# Patient Record
Sex: Female | Born: 1935 | Race: White | Hispanic: No
Health system: Southern US, Community
[De-identification: ages and names within clinical notes are randomized; demographics above are authoritative.]

---

## 2004-12-08 ENCOUNTER — Ambulatory Visit: Payer: Self-pay | Admitting: Otolaryngology

## 2004-12-24 ENCOUNTER — Ambulatory Visit: Payer: Self-pay | Admitting: Otolaryngology

## 2004-12-29 ENCOUNTER — Inpatient Hospital Stay: Payer: Self-pay | Admitting: Otolaryngology

## 2005-08-23 ENCOUNTER — Ambulatory Visit: Payer: Self-pay | Admitting: Family Medicine

## 2005-09-01 ENCOUNTER — Ambulatory Visit: Payer: Self-pay | Admitting: Orthopaedic Surgery

## 2006-03-06 ENCOUNTER — Inpatient Hospital Stay: Payer: Self-pay | Admitting: Orthopaedic Surgery

## 2006-09-21 ENCOUNTER — Ambulatory Visit: Payer: Self-pay | Admitting: Family Medicine

## 2006-09-28 IMAGING — CT CT NECK WITHOUT AND WITH CONTRAST
1 of 5 series · 5 of 14 positions shown, 7 images · non-contrast
Comparison: none

REASON FOR EXAM: Acute Lymphadenitis Neck Swelling
COMMENTS:

[Series 5: inspace with · axial · 0.42mm/px · z∈[+1170,+1349]mm · 5 of 337 slices shown, 7 images]
[im 57/337  soft-tissue]
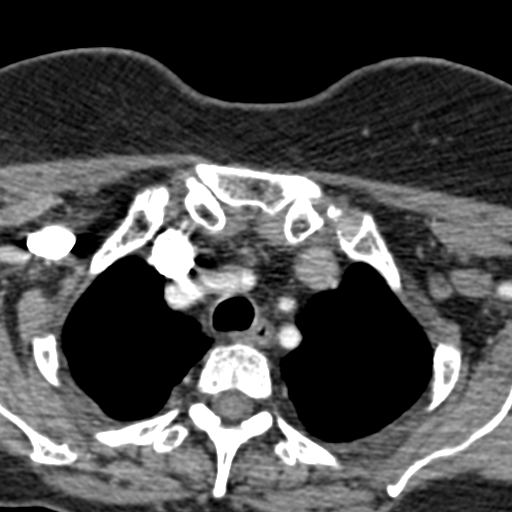
[im 57/337  bone]
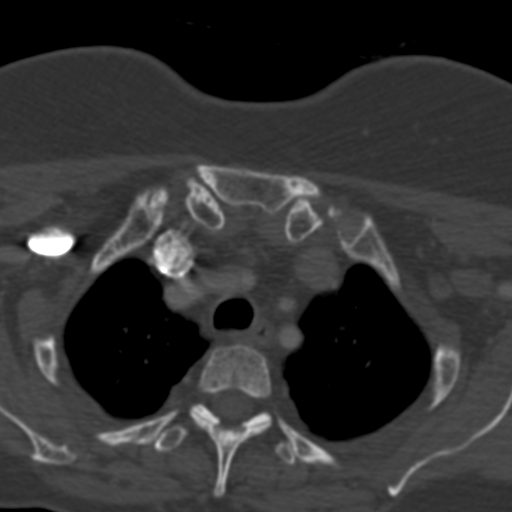
[im 113/337  bone]
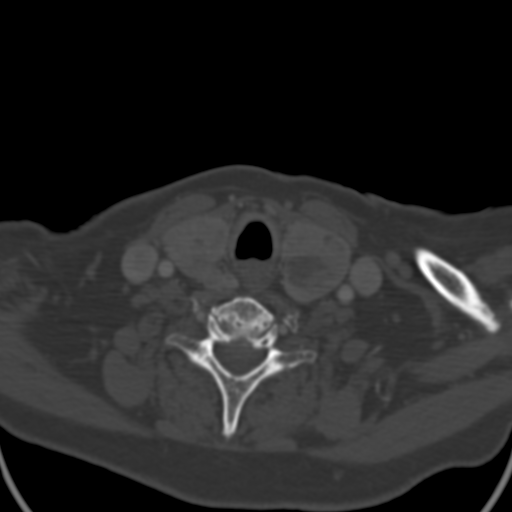
[im 169/337  bone]
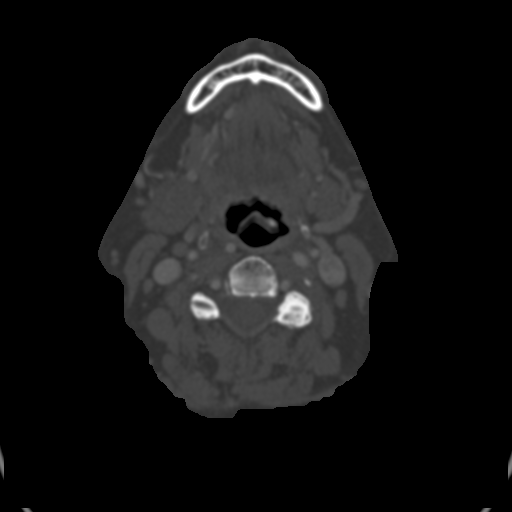
[im 225/337  bone]
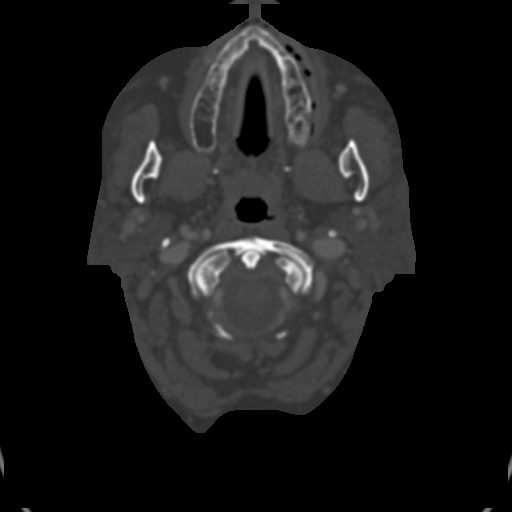
[im 281/337  soft-tissue]
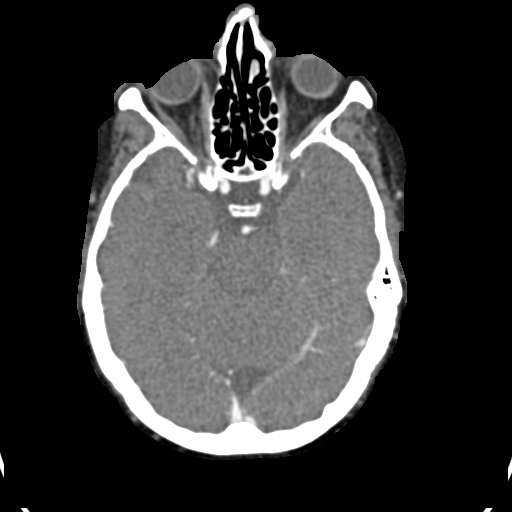
[im 281/337  bone]
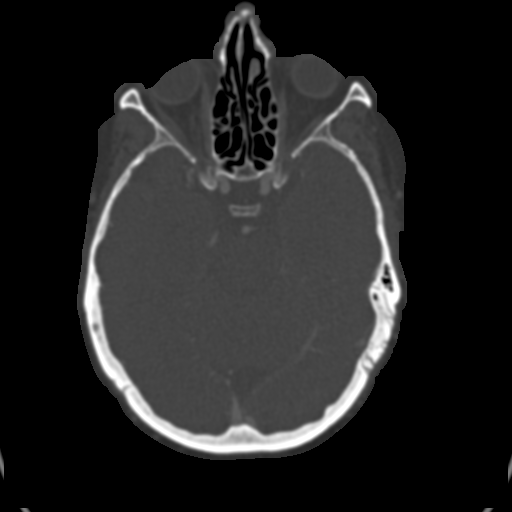

[5 of 14 positions shown; findings below may reference images not displayed]

PROCEDURE:     CT  - CT NECK W/WO  - December 08, 2004  [DATE]

RESULT:        Images were obtained without and with contrast.  The patient
has neck swelling.

On the noncontrast images, the parotid and submandibular glands appear
normal in density and contour.  There is a calcification that is associated
with the anterior/inferior aspect of the LEFT submandibular gland.  This may
reflect a sialolith.   It measures approximately 3 mm in diameter. It is
seen on images 24 and 25.  There is a radiodensity associated with the LEFT
side of the epiglottis.  This measures approximately 3 x 4 mm and is of
uncertain significance.  It is seen on images 28 through 30 and may reflect
some calcification of cartilage.  Following intravenous administration of 75
cc of Nsovue-MOO,  the enhancement pattern of the structures within the neck
appears normal.  The thyroid lobes are enlarged and heterogenous in their
enhancement and this may reflect the presence of multinodular goiter.  The
thyroid lobes extend inferiorly just to the dome of the lungs.  No
intrathoracic component is seen.  The jugular and carotid vessels enhance in
a normal fashion.  The parotid and submandibular glands enhance normally.
The calcification that appears to be associated with the surface of the
anteromedial aspect of the LEFT submandibular gland remains.

I do not see evidence of bulky cervical lymphadenopathy.  A few small lymph
nodes are associated with the jugular and carotid vessels.  I do not see
bulky lymphadenopathy at the base of the neck either.
IMPRESSION: 1.     There is enlargement of both thyroid lobes in a fashion that suggest
a multinodular goiter.
2.     I do not see pathologic size cervical lymph nodes.
3.     There is a normal appearance of the parotid glands.  The
submandibular gland on the LEFT while normal in density does exhibit a
calcification along its anterior medial surface that could reflect a stone.
4.     There is calcification associated with the LEFT aspect of the
epiglottis.

## 2007-11-07 ENCOUNTER — Ambulatory Visit: Payer: Self-pay | Admitting: Family Medicine

## 2007-12-05 ENCOUNTER — Ambulatory Visit: Payer: Self-pay | Admitting: Gastroenterology

## 2008-09-25 ENCOUNTER — Ambulatory Visit: Payer: Self-pay

## 2008-10-31 ENCOUNTER — Encounter: Admission: RE | Admit: 2008-10-31 | Discharge: 2008-10-31 | Payer: Self-pay | Admitting: Neurology

## 2009-01-26 ENCOUNTER — Ambulatory Visit: Payer: Self-pay | Admitting: General Practice

## 2009-03-31 ENCOUNTER — Ambulatory Visit: Payer: Self-pay | Admitting: Family Medicine

## 2009-08-20 ENCOUNTER — Ambulatory Visit: Payer: Self-pay | Admitting: Family Medicine

## 2009-08-20 ENCOUNTER — Encounter: Payer: Self-pay | Admitting: Internal Medicine

## 2009-09-22 DIAGNOSIS — M7150 Other bursitis, not elsewhere classified, unspecified site: Secondary | ICD-10-CM | POA: Insufficient documentation

## 2009-09-22 DIAGNOSIS — M810 Age-related osteoporosis without current pathological fracture: Secondary | ICD-10-CM | POA: Insufficient documentation

## 2009-09-22 DIAGNOSIS — Z8542 Personal history of malignant neoplasm of other parts of uterus: Secondary | ICD-10-CM

## 2009-09-22 DIAGNOSIS — J4489 Other specified chronic obstructive pulmonary disease: Secondary | ICD-10-CM | POA: Insufficient documentation

## 2009-09-22 DIAGNOSIS — M129 Arthropathy, unspecified: Secondary | ICD-10-CM | POA: Insufficient documentation

## 2009-09-22 DIAGNOSIS — J449 Chronic obstructive pulmonary disease, unspecified: Secondary | ICD-10-CM

## 2009-09-22 DIAGNOSIS — K219 Gastro-esophageal reflux disease without esophagitis: Secondary | ICD-10-CM

## 2009-09-22 DIAGNOSIS — Z8719 Personal history of other diseases of the digestive system: Secondary | ICD-10-CM

## 2009-09-22 DIAGNOSIS — D126 Benign neoplasm of colon, unspecified: Secondary | ICD-10-CM

## 2009-09-22 DIAGNOSIS — I10 Essential (primary) hypertension: Secondary | ICD-10-CM | POA: Insufficient documentation

## 2009-09-23 ENCOUNTER — Ambulatory Visit: Payer: Self-pay | Admitting: Internal Medicine

## 2009-09-23 DIAGNOSIS — J310 Chronic rhinitis: Secondary | ICD-10-CM

## 2009-09-23 DIAGNOSIS — R0989 Other specified symptoms and signs involving the circulatory and respiratory systems: Secondary | ICD-10-CM

## 2009-09-23 DIAGNOSIS — R0609 Other forms of dyspnea: Secondary | ICD-10-CM

## 2009-09-24 ENCOUNTER — Telehealth: Payer: Self-pay | Admitting: Internal Medicine

## 2009-09-24 LAB — CONVERTED CEMR LAB
AST: 19 units/L (ref 0–37)
Bilirubin, Direct: 0.1 mg/dL (ref 0.0–0.3)
Calcium: 9.1 mg/dL (ref 8.4–10.5)
Creatinine, Ser: 0.9 mg/dL (ref 0.4–1.2)
TSH: 0.43 microintl units/mL (ref 0.35–5.50)
Total Bilirubin: 0.6 mg/dL (ref 0.3–1.2)

## 2009-10-13 ENCOUNTER — Ambulatory Visit: Payer: Self-pay | Admitting: Internal Medicine

## 2009-11-03 ENCOUNTER — Encounter: Payer: Self-pay | Admitting: Internal Medicine

## 2009-11-12 ENCOUNTER — Encounter: Admission: RE | Admit: 2009-11-12 | Discharge: 2009-11-12 | Payer: Self-pay | Admitting: Orthopedic Surgery

## 2009-11-24 ENCOUNTER — Encounter: Admission: RE | Admit: 2009-11-24 | Discharge: 2009-11-24 | Payer: Self-pay | Admitting: Orthopedic Surgery

## 2010-01-19 ENCOUNTER — Encounter: Payer: Self-pay | Admitting: Internal Medicine

## 2010-02-01 ENCOUNTER — Encounter: Admission: RE | Admit: 2010-02-01 | Discharge: 2010-02-01 | Payer: Self-pay | Admitting: Orthopedic Surgery

## 2010-05-10 ENCOUNTER — Ambulatory Visit: Payer: Self-pay | Admitting: Family Medicine

## 2010-05-17 ENCOUNTER — Ambulatory Visit: Payer: Self-pay | Admitting: Family Medicine

## 2010-07-19 ENCOUNTER — Ambulatory Visit: Payer: Self-pay | Admitting: General Practice

## 2010-08-06 ENCOUNTER — Inpatient Hospital Stay: Payer: Self-pay | Admitting: General Practice

## 2010-08-11 ENCOUNTER — Encounter: Payer: Self-pay | Admitting: Internal Medicine

## 2010-09-04 ENCOUNTER — Encounter: Payer: Self-pay | Admitting: Internal Medicine

## 2011-01-04 NOTE — Medication Information (Signed)
Summary: Prior [facility] for Aciphex/HUmana  Prior [employer] for Aciphex/HUmana   Imported By: Sherian Rein 01/22/2010 10:04:02  _____________________________________________________________________  External Attachment:    Type:   Image     Comment:   External Document

## 2011-06-02 ENCOUNTER — Ambulatory Visit: Payer: Self-pay | Admitting: Family Medicine

## 2011-08-17 ENCOUNTER — Ambulatory Visit: Payer: Self-pay | Admitting: Family Medicine

## 2011-08-22 ENCOUNTER — Inpatient Hospital Stay: Payer: Self-pay | Admitting: Internal Medicine

## 2011-08-27 ENCOUNTER — Emergency Department: Payer: Self-pay | Admitting: Emergency Medicine

## 2011-09-20 ENCOUNTER — Telehealth: Payer: Self-pay

## 2011-09-20 DIAGNOSIS — K831 Obstruction of bile duct: Secondary | ICD-10-CM

## 2011-09-20 NOTE — Telephone Encounter (Signed)
Pt has been instructed and meds reviewed instructions have been mailed and pt will call with any questions

## 2011-10-06 ENCOUNTER — Ambulatory Visit: Payer: Self-pay | Admitting: Internal Medicine

## 2011-10-06 ENCOUNTER — Encounter: Payer: Self-pay | Admitting: Gastroenterology

## 2011-10-06 ENCOUNTER — Ambulatory Visit: Payer: Self-pay

## 2011-10-09 ENCOUNTER — Inpatient Hospital Stay: Payer: Self-pay | Admitting: Internal Medicine

## 2011-10-10 LAB — CEA: CEA: 1.7 ng/mL (ref 0.0–4.7)

## 2011-10-11 ENCOUNTER — Telehealth: Payer: Self-pay | Admitting: Gastroenterology

## 2011-10-11 LAB — CANCER ANTIGEN 27.29: CA 27.29: 17.2 U/mL (ref 0.0–38.6)

## 2011-10-11 NOTE — Telephone Encounter (Signed)
i spoke with her daughter.  She was admitted to hospital ([facility]) two days after EUS for 'dehyration.'  She was told pancreas, liver enzyme were all normal and that she had a 'mild uti.'  Patient, care team aware of fna results. ("negative for malignancy, predominatly blood")

## 2011-10-14 ENCOUNTER — Encounter: Payer: Self-pay | Admitting: Gastroenterology

## 2011-10-18 ENCOUNTER — Ambulatory Visit: Payer: Self-pay | Admitting: Internal Medicine

## 2011-11-05 ENCOUNTER — Ambulatory Visit: Payer: Self-pay | Admitting: Internal Medicine

## 2012-01-26 ENCOUNTER — Ambulatory Visit: Payer: Self-pay | Admitting: Internal Medicine

## 2012-01-26 LAB — BASIC METABOLIC PANEL
Anion Gap: 7 (ref 7–16)
BUN: 29 mg/dL — ABNORMAL HIGH (ref 7–18)
Chloride: 106 mmol/L (ref 98–107)
Creatinine: 1.53 mg/dL — ABNORMAL HIGH (ref 0.60–1.30)
Glucose: 131 mg/dL — ABNORMAL HIGH (ref 65–99)

## 2012-02-03 ENCOUNTER — Ambulatory Visit: Payer: Self-pay | Admitting: Internal Medicine

## 2012-02-20 LAB — HEPATIC FUNCTION PANEL A (ARMC)
Albumin: 3.2 g/dL — ABNORMAL LOW (ref 3.4–5.0)
Bilirubin,Total: 0.8 mg/dL (ref 0.2–1.0)
SGOT(AST): 52 U/L — ABNORMAL HIGH (ref 15–37)
SGPT (ALT): 78 U/L
Total Protein: 7.6 g/dL (ref 6.4–8.2)

## 2012-02-20 LAB — BASIC METABOLIC PANEL
Anion Gap: 6 — ABNORMAL LOW (ref 7–16)
Calcium, Total: 8.8 mg/dL (ref 8.5–10.1)
Chloride: 106 mmol/L (ref 98–107)
Co2: 29 mmol/L (ref 21–32)
Creatinine: 1.26 mg/dL (ref 0.60–1.30)
EGFR (African American): 53 — ABNORMAL LOW
Sodium: 141 mmol/L (ref 136–145)

## 2012-02-21 LAB — CREATININE, SERUM: EGFR (Non-African Amer.): 59 — ABNORMAL LOW

## 2012-03-05 ENCOUNTER — Ambulatory Visit: Payer: Self-pay | Admitting: Internal Medicine

## 2012-04-04 ENCOUNTER — Ambulatory Visit: Payer: Self-pay | Admitting: Internal Medicine

## 2012-07-13 ENCOUNTER — Ambulatory Visit: Payer: Self-pay | Admitting: Family Medicine

## 2012-08-21 ENCOUNTER — Ambulatory Visit: Payer: Self-pay | Admitting: Internal Medicine

## 2012-08-21 LAB — CREATININE, SERUM: EGFR (African American): 39 — ABNORMAL LOW

## 2012-08-23 ENCOUNTER — Ambulatory Visit: Payer: Self-pay | Admitting: General Surgery

## 2012-08-23 LAB — CREATININE, SERUM
Creatinine: 1.11 mg/dL (ref 0.60–1.30)
EGFR (African American): 55 — ABNORMAL LOW
EGFR (Non-African Amer.): 47 — ABNORMAL LOW

## 2012-09-04 ENCOUNTER — Ambulatory Visit: Payer: Self-pay | Admitting: Internal Medicine

## 2013-02-02 ENCOUNTER — Ambulatory Visit: Payer: Self-pay | Admitting: Internal Medicine

## 2013-02-10 LAB — COMPREHENSIVE METABOLIC PANEL
Alkaline Phosphatase: 145 U/L — ABNORMAL HIGH (ref 50–136)
Creatinine: 1.59 mg/dL — ABNORMAL HIGH (ref 0.60–1.30)
EGFR (African American): 35 — ABNORMAL LOW
EGFR (Non-African Amer.): 31 — ABNORMAL LOW
Osmolality: 286 (ref 275–301)
SGOT(AST): 20 U/L (ref 15–37)
SGPT (ALT): 16 U/L (ref 12–78)
Sodium: 137 mmol/L (ref 136–145)
Total Protein: 7.7 g/dL (ref 6.4–8.2)

## 2013-02-10 LAB — URINALYSIS, COMPLETE
Nitrite: NEGATIVE
Protein: 100
Specific Gravity: 1.025 (ref 1.003–1.030)
Squamous Epithelial: 6
WBC UR: 2 /HPF (ref 0–5)

## 2013-02-10 LAB — CBC
HGB: 12 g/dL (ref 12.0–16.0)
MCHC: 33.1 g/dL (ref 32.0–36.0)
MCV: 97 fL (ref 80–100)
RBC: 3.73 10*6/uL — ABNORMAL LOW (ref 3.80–5.20)
RDW: 13.2 % (ref 11.5–14.5)
WBC: 11.2 10*3/uL — ABNORMAL HIGH (ref 3.6–11.0)

## 2013-02-10 LAB — LIPASE, BLOOD: Lipase: 56 U/L — ABNORMAL LOW (ref 73–393)

## 2013-02-11 ENCOUNTER — Inpatient Hospital Stay: Payer: Self-pay | Admitting: Internal Medicine

## 2013-02-11 LAB — CBC WITH DIFFERENTIAL/PLATELET
Basophil #: 0.1 10*3/uL (ref 0.0–0.1)
Basophil #: 0 10*3/uL (ref 0.0–0.1)
Basophil %: 1 %
Basophil %: 0.5 %
Eosinophil #: 0.2 10*3/uL (ref 0.0–0.7)
Eosinophil #: 0.2 10*3/uL (ref 0.0–0.7)
Eosinophil %: 2.1 %
Eosinophil %: 2.8 %
Eosinophil %: 2.5 %
HCT: 32.5 % — ABNORMAL LOW (ref 35.0–47.0)
HCT: 32.4 % — ABNORMAL LOW (ref 35.0–47.0)
HGB: 10.7 g/dL — ABNORMAL LOW (ref 12.0–16.0)
HGB: 10.5 g/dL — ABNORMAL LOW (ref 12.0–16.0)
HGB: 10.7 g/dL — ABNORMAL LOW (ref 12.0–16.0)
Lymphocyte #: 2.1 10*3/uL (ref 1.0–3.6)
Lymphocyte #: 1.3 10*3/uL (ref 1.0–3.6)
Lymphocyte %: 19.8 %
Lymphocyte %: 19.8 %
MCH: 32.1 pg (ref 26.0–34.0)
MCH: 31.9 pg (ref 26.0–34.0)
MCH: 31.7 pg (ref 26.0–34.0)
MCHC: 32.7 g/dL (ref 32.0–36.0)
MCV: 98 fL (ref 80–100)
Monocyte #: 0.6 x10 3/mm (ref 0.2–0.9)
Monocyte %: 9.6 %
Monocyte %: 9.6 %
Neutrophil #: 5.4 10*3/uL (ref 1.4–6.5)
Neutrophil #: 5.5 10*3/uL (ref 1.4–6.5)
Neutrophil %: 68.3 %
Platelet: 118 10*3/uL — ABNORMAL LOW (ref 150–440)
RBC: 3.32 10*6/uL — ABNORMAL LOW (ref 3.80–5.20)
RBC: 3.38 10*6/uL — ABNORMAL LOW (ref 3.80–5.20)
WBC: 8.5 10*3/uL (ref 3.6–11.0)
WBC: 6.7 10*3/uL (ref 3.6–11.0)

## 2013-02-11 LAB — DIFFERENTIAL
Basophil #: 0 10*3/uL (ref 0.0–0.1)
Eosinophil #: 0 10*3/uL (ref 0.0–0.7)
Lymphocyte %: 14 %
Monocyte %: 8.3 %
Neutrophil %: 76.9 %

## 2013-02-11 LAB — LIPASE, BLOOD: Lipase: 69 U/L — ABNORMAL LOW (ref 73–393)

## 2013-02-12 LAB — HEMOGLOBIN: HGB: 9.8 g/dL — ABNORMAL LOW (ref 12.0–16.0)

## 2013-02-12 LAB — BASIC METABOLIC PANEL
Anion Gap: 7 (ref 7–16)
Creatinine: 1.32 mg/dL — ABNORMAL HIGH (ref 0.60–1.30)
EGFR (African American): 44 — ABNORMAL LOW
EGFR (Non-African Amer.): 38 — ABNORMAL LOW
Glucose: 122 mg/dL — ABNORMAL HIGH (ref 65–99)
Potassium: 3.9 mmol/L (ref 3.5–5.1)
Sodium: 140 mmol/L (ref 136–145)

## 2013-02-13 LAB — CBC WITH DIFFERENTIAL/PLATELET
Eosinophil #: 0.2 10*3/uL (ref 0.0–0.7)
HGB: 9.6 g/dL — ABNORMAL LOW (ref 12.0–16.0)
Lymphocyte #: 1.3 10*3/uL (ref 1.0–3.6)
MCH: 33.1 pg (ref 26.0–34.0)
MCHC: 34.1 g/dL (ref 32.0–36.0)
MCV: 97 fL (ref 80–100)
Monocyte %: 10.6 %

## 2013-02-13 LAB — BASIC METABOLIC PANEL
Anion Gap: 7 (ref 7–16)
BUN: 12 mg/dL (ref 7–18)
Creatinine: 1.07 mg/dL (ref 0.60–1.30)
Glucose: 153 mg/dL — ABNORMAL HIGH (ref 65–99)
Potassium: 3.8 mmol/L (ref 3.5–5.1)
Sodium: 142 mmol/L (ref 136–145)

## 2013-03-05 ENCOUNTER — Ambulatory Visit: Payer: Self-pay | Admitting: Internal Medicine

## 2013-04-22 ENCOUNTER — Ambulatory Visit: Payer: Self-pay | Admitting: Family Medicine

## 2013-05-05 ENCOUNTER — Ambulatory Visit: Payer: Self-pay | Admitting: Family Medicine

## 2013-12-05 DEATH — deceased

## 2015-03-27 NOTE — H&P (Signed)
PATIENT NAME:  Amy Waters, Amy Waters MR#:  [id number] DATE OF BIRTH:  03-17-1931  DATE OF ADMISSION:  02/11/2013  PRIMARY GASTROENTEROLOGIST:  Dr. Dionne Milo.   CHIEF COMPLAINT:  Nausea, vomiting.   HISTORY OF PRESENT ILLNESS:  The patient is a 81 year old pleasant white female with past medical history of pancreatic mass diagnosed in 2010, when the patient presented with obstructive jaundice. The patient has about 3 to 2 cm of mass. The patient underwent ERCP and biliary stent placement with complete resolution of the obstructive jaundice. At that time, the biopsy came back to be negative for any malignancy. CA 19.9 is within normal limits. The patient has been following up with Dr. 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 Followup CT scans did not show any increased growth of the tumor. The patient presented to the Emergency Department with complaints of nausea, vomiting, started from Tuesday.  Started having nausea, had multiple episodes of vomiting Tuesday night, as well as Wednesday. On Thursday, somewhat felt better, still had some nausea. Friday, came to her daughter's house and started to have multiple episodes of vomiting. This was also associated with black to emesis. Concerning this, they called Dr. Michaelle Copas office who recommended the patient to come to the Emergency Department. The patient's initial hemoglobin was 12, which is the patient's baseline. The patient has significantly decreased p.o. intake for the last 4 days. Also complains of abdominal pain in the epigastric and right upper and left upper quadrant areas. No radiation, dull aching pain. The patient also has been having retching without any vomitus. Denies having any fever. Denies having any sick contacts. Denies eating any food from outside or leftover food. The patient received Zofran in the Emergency Department with some improvement of the nausea. Lipase done, which is well within normal limits of 53. Denies having any  diarrhea. Denies having any black stools. Last bowel movement was yesterday, which was well-formed stools.  PAST MEDICAL HISTORY: 1.  Pancreatic mass, status post EUS-FNA, which was negative, status post ERCP and stent placement. CA 19.9 is well within normal limits.  2.  Chronic anemia felt to be anemia of chronic disease.  3.  Hypertension.  4.  Anxiety.  5.  Right knee arthroplasty.  6.  Rotator cuff tear requiring repair x 3.  7.  Extensive history of tobacco use, quit 15 years back.   ALLERGIES:  MORPHINE CAUSED HIVES.   HOME MEDICATIONS:  1.  Xanax 0.5 mg 2 tablets daily.  2.  Vitamin D3, 2000 units once a day.  3.  Spiriva 18 mcg inhalation once a day.  4.   5.   6.  Omeprazole 20 mg capsule once a day.   SOCIAL HISTORY:  Smoked 1 pack a day since the age of 17, quit 15 years back. Drinks 1 beer on a daily basis. Denies using any illicit drugs. Lives by herself. The patient's daughter has a close watch on the patient.   FAMILY HISTORY:  Father lived to be 19, has longevity, however, died of congestive heart failure.   REVIEW OF SYSTEMS:  Currently has generalized weakness.  EYES:  No change in vision.  EAR, NOSE, THROAT:  No sore throat.  RESPIRATORY:  No cough, shortness of breath.  CARDIOVASCULAR:  No chest pain, palpitations.  GASTROINTESTINAL:  Nausea, vomiting.  GENITOURINARY:  No dysuria or hematuria.    SKIN:  No lesions or rash.  ENDOCRINE:   No polyuria or polydipsia.  NEUROLOGIC:  No weakness in  any part of the body.   PHYSICAL EXAMINATION: GENERAL:  This is a well-built, well-nourished, age-appropriate female lying down in the bed, not in distress.  VITAL SIGNS:  Temperature 97.4, pulse 70, blood pressure 140/67, respiratory rate of 16, oxygen saturation is 92% on room air.  HEENT:  Head normocephalic, atraumatic. There is no scleral icterus. Conjunctivae normal. Pupils equal and reactive to light. Mucous membranes are dry.  NECK:  Supple. No lymphadenopathy.  No JVD. No carotid bruit.  CHEST:  Has no focal tenderness.  Bilateral prolonged expiratory phase with occasional wheezing.  HEART:  Heart S1 and S2 regular, no murmurs are heard.  ABDOMEN:  Bowel sounds present. Soft. Has tenderness in the epigastric right upper and left upper quadrant areas.  No guarding or rebound tenderness.  EXTREMITIES:  No pedal edema. Pulses 2+.  NEUROLOGIC:  The patient is alert, oriented to place, person and time. Cranial nerves II through XII intact. No motor and sensory deficits.   LABORATORY DATA:  UA negative for nitrites and leukocyte esterase. CMP:  Glucose 219, BUN 28, creatinine of 1.59, alk phos of 145. Rest of all the values are within normal limits.   CBC: WBC of 11.2, hemoglobin 12, platelet count of 164. Rest of all the values are within normal limits.   Lipase is 56.   CT abdomen and pelvis, done in the Emergency Department, showed right lower lobe small spiculated mass. Recommended followup in 3 months.   ASSESSMENT AND PLAN:  The patient is a 81 year old female who comes to the Emergency Department with nausea and vomiting of 3 days duration, associated with abdominal pain.  1.  Abdominal pain, most likely from the gastritis. However, cannot exclude other causes. The patient's lipase is well within normal limits. We will obtain right upper quadrant ultrasound to rule out any cholelithiasis. However, CT abdomen and pelvis were negative for any cholelithiasis. Mild elevation of the WBC count could be from the dehydration. We will hydrate the patient.  2.  Nausea, vomiting, possibly from the gastritis. Continue with IV fluids. Keep the patient n.p.o. Continue Zofran.  3.  Gastrointestinal bleed, most likely from Mallory-Weiss tear.  Keep the patient n.p.o. Continue with Protonix drip. Consult GI in the morning, Dr. Dionne Milo.  4.  Pancreatic mass per recent CAT scan. This is stable, as well as CA 19.9.  Will follow up with her gastroenterologist.  5.  Small  lung mass in the right lower lobe. Will need followup in 3 months.   6.  Acute renal insufficiency secondary to dehydration from vomiting. Continue with IV fluids.  7.  Keep the patient on deep vein thrombosis prophylaxis with sequential compression devices.   ____________________________ Monica Becton, MD pv:dmm D: 02/11/2013 02:47:28 ET T: 02/11/2013 09:51:54 ET JOB#: [id number]  cc: Monica Becton, MD, <Dictator> Monica Becton MD ELECTRONICALLY SIGNED 02/12/2013 7:36

## 2015-03-27 NOTE — Consult Note (Signed)
PATIENT NAME:  Amy Waters, Amy Waters MR#:  [id number] DATE OF BIRTH:  [birth year]  DATE OF CONSULTATION:  02/11/2013  REFERRING PHYSICIAN:  Monica Becton, MD  CONSULTING PHYSICIAN:  Loistine Simas, MD/Wilbon Obenchain M. Lalah Durango, PA-C  REASON FOR CONSULT: Nausea and vomiting.  HISTORY OF PRESENT ILLNESS: The patient is a pleasant 81 year old female who was seen in consultation at the request of Dr. Lunette Stands for evaluation of nausea and vomiting.  She is accompanied at bedside by 2 family members who state that the nausea and vomiting came on quite suddenly last Tuesday, approximately 6 days. Since that time, she has had daily vomiting with an inability to tolerate anything p.o.  The first few days she states she was vomiting every hour, and it was bilious and the food that she had eaten.  By Friday, about 4 days into the onset of symptoms, she did developed coffee-ground emesis which did persist up until presenting to the hospital.  She denies any changes in her bowel habits.  There has been no change in stool caliber, color or frequency.  There is no bright red blood per rectum or melena.  There has been no light-headedness or dizziness.  She does state that she is having bilateral upper quadrant discomfort but denies any lower abdominal pains.  No fever or chills.  There have been no sick contacts, no recent travel, no NSAID use.  Of note, she does have a past medical history significant for a pancreatic mass that was found to be benign, however, did require biliary stenting.  She has followed up with [facility] for this, and she does currently have a metal stent in place in the common bile duct.  Currently, her LFTs and lipase are within normal limits.  Of note, her hemoglobin was 12.0 and declined today to 10.7.  She has not had any further emesis today. She was started on a Protonix drip as well as been given p.r.n.  Zofran and states that she feels much better and is developing an appetite.    PAST MEDICAL HISTORY: 1. Had  benign pancreatic mass requiring biliary stenting.  2. History of uterine cancer, in remission. 3. Osteoarthritis. 4. Hemorrhoids. 5. Tremor.  6. Early dementia. 7. Occasional dyspnea requiring Spiriva as treatment.  The patient denies having a diagnosis of COPD.    PAST SURGICAL HISTORY: 1. Hysterectomy.  2. A gland removed from the neck.  3. Right rotator cuff surgery. 4. Metal biliary stent placement via ERCP.  5. Right knee surgery.   FAMILY HISTORY: Her brother had cancer; however, she is unsure of what type.  She also states that her sister had breast cancer.  No known family history of GI malignancy, specifically.  No history of colon polyps or IBD in the family, either.   SOCIAL HISTORY: The patient does report remote tobacco use but states that she quit 15 to 20 years ago.  She does state that each night she has 1 beer before she goes to sleep. No illicit drug use.   REVIEW OF SYSTEMS: A 12-system review of systems was obtained on the patient.  All pertinent positives are mentioned above and are otherwise negative.   MEDICATIONS:  Currently, Protonix drip, Zofran, fentanyl, IV fluids, alprazolam, aspirin, irbesartan, lorazepam, morphine, nitroglycerin, simvastatin, sertraline, Spiriva and rifaximin.    PHYSICAL EXAMINATION:  VITAL SIGNS: Blood pressure 125/62, heart rate 64, respirations 22, temperature 97.8. Pulse ox is 100% on 2 liters nasal cannula.  GENERAL: This is a 81 year old female resting quietly  and comfortably in bed, accompanied by 2 family members at bedside, alert and oriented x 3, in no acute distress.   HEENT: Head atraumatic, normocephalic.  Sclerae anicteric.  Mucous membranes are moist.  Nasal cannula noted. NECK: Supple, no lymphadenopathy noted.  PULMONARY: Respirations are even and unlabored, clear to auscultation bilaterally in 2 lungs.  CARDIAC: Regular rate and rhythm, S1, S2 noted.  ABDOMEN: Soft.  Mild tenderness to palpation is noted in the  epigastric region without guarding or rebound. Normoactive bowel sounds are noted in all 4 quadrants. No masses palpated.  No signs of an acute abdomen.  No hernia is appreciated.  RECTAL:  Deferred.  EXTREMITIES: Negative for lower extremity edema, 2+ pulses noted bilaterally.  NEUROLOGICAL:  Cranial nerves II through XII grossly intact.  Alert and oriented x 3. Appropriate mood and affect.  LABORATORY AND RADIOLOGICAL DATA:  White blood cells 8.5, hemoglobin 10.7, hematocrit 32.5, platelets 126.  Sodium 137, potassium 3.9, BUN 28, creatinine 1.59, glucose 219, lipase 69, total bilirubin 0.6, alkaline phos 45, AST 20, ALT 16, calcium 9.7, albumin 2.3.  An x-ray of the abdomen was obtained on the patient showing a nonobstructive bowel gas pattern as well as a metal biliary stent in place.   A CT of the abdomen and pelvis was obtained on the patient showing evidence of a common bile duct stent with associated pneumobilia consistent with prior ERCP. There is also a pancreatic head mass-like appearance which has previously been worked up and was found to be benign through [facility].  There is also right lower lung lobe opacity noted.   ASSESSMENT:  1. Intractable nausea and vomiting with recent coffee-ground emesis, rule out GI  bleed.  2. Mild normocytic anemia.  3. Abnormal CT scan showing a pancreatic head mass-like appearance which has previously been worked up at [facility] and was found to be negative for malignancy.  Currently, she does still have a metal biliary stent in place in the common bile duct.   PLAN: I have discussed this patient's case in detail with Dr. Loistine Simas, who is involved in the development of the patient's plan of care.  At this time, we do agree with the patient being placed on a Protonix drip as well as antiemetics as needed for vomiting.  We recommend that she be maintained n.p.o. status as we would like to proceed with an EGD today to further evaluate the persistence of her  nausea and vomiting as well as her recent coffee-ground emesis.  We do agree with serial H and H for now. We are working on ruling out an upper GI bleed.  The alternatives, risks and benefits of the EGD were discussed in detail with the patient and the patient's family members, who verbalized understanding and are in agreement.  All questions were answered.    Further recommendations are pending the EGD and per clinical course.   The above plan of care was discussed and agreed upon under a supervisory agreement between myself and Dr. Loistine Simas.      Thank you so much for this consultation and allowing [place] to participate in the patient's plan of care.     ____________________________ Corky Sox. Paz Fuentes, PA-C kme:cb D: 02/11/2013 11:42:13 ET T: 02/11/2013 12:11:53 ET JOB#: [id number]  cc: Corky Sox. Neville Pauls, PA-C, <Dictator> [place] PA ELECTRONICALLY SIGNED 02/11/2013 14:28

## 2015-03-27 NOTE — Consult Note (Signed)
Chief Complaint:  Subjective/Chief Complaint seen for hematemesis.  patient toleratinf full liquids.  less abdominal distension today, no n/v, continues with upper abdominal pain, though also less than on admission.   VITAL SIGNS/ANCILLARY NOTES: **Vital Signs.:   12-Mar-14 05:37  Vital Signs Type Routine  Temperature Temperature (F) 98  Celsius 36.6  Temperature Source oral  Pulse Pulse 77  Respirations Respirations 18  Systolic BP Systolic BP 893  Diastolic BP (mmHg) Diastolic BP (mmHg) 73  Mean BP 90  Pulse Ox % Pulse Ox % 94  Pulse Ox Activity Level  At rest  Oxygen Delivery Room Air/ 21 %    13:50  Vital Signs Type Routine  Temperature Temperature (F) 97.9  Celsius 36.6  Temperature Source oral  Pulse Pulse 75  Respirations Respirations 18  Systolic BP Systolic BP 810  Diastolic BP (mmHg) Diastolic BP (mmHg) 70  Mean BP 90  Pulse Ox % Pulse Ox % 97  Pulse Ox Activity Level  At rest  Oxygen Delivery Room Air/ 21 %   Brief Assessment:  Cardiac Irregular   Respiratory clear BS   Gastrointestinal details normal Soft  Nondistended  No masses palpable  Bowel sounds normal  mild generalized upper abdominal discomfort   Lab Results: Routine Chem:  12-Mar-14 06:55   Glucose, Serum  153  BUN 12  Creatinine (comp) 1.07  Sodium, Serum 142  Potassium, Serum 3.8  Chloride, Serum  109  CO2, Serum 26  Calcium (Total), Serum  7.9  Anion Gap 7 (Result(s) reported on 13 Feb 2013 at 07:42AM.)  Osmolality (calc) 286  Routine Hem:  12-Mar-14 06:55   WBC (CBC) 6.2  RBC (CBC)  2.88  Hemoglobin (CBC)  9.6  Hematocrit (CBC)  28.0  Platelet Count (CBC)  111  MCV 97  MCH 33.1  MCHC 34.1  RDW 13.1  Neutrophil % 63.8  Lymphocyte % 21.3  Monocyte % 10.6  Eosinophil % 3.6  Basophil % 0.7  Neutrophil # 3.9  Lymphocyte # 1.3  Monocyte # 0.7  Eosinophil # 0.2  Basophil # 0.0 (Result(s) reported on 13 Feb 2013 at 07:42AM.)   Radiology Results: XRay:    12-Mar-14 15:16,  Abdomen Flat and Erect  Abdomen Flat and Erect   REASON FOR EXAM:    partial gastric outlet obstruction  COMMENTS:       PROCEDURE: DXR - DXR ABDOMEN 2 V FLAT AND ERECT  - Feb 13 2013  3:16PM     RESULT: Comparison is made to the study of 12 February 2013. The stent graft   is seen over the right upper quadrant. There is a moderately large amount   of gastric distention similar to that seen previously. Air is seen in the   colon and in loops of small bowel. Degenerative changes are seen in the   spine.    IMPRESSION:  Essentially stable abdominal study. Continued findings   concerning for gastric outlet obstruction. Stent in the right upper   quadrant could be biliary or vascular. This was seen to be in the common   bile duct on previous CT from September 2013.  Dictation Site: 2        Verified By: Sundra Aland, M.D., MD   Assessment/Plan:  Assessment/Plan:  Assessment 1) hematemesis-finding of DU on egd, with near complete obstruction of the duodenum in the second portion for local edema versus progressive pancreatic mass. Of note patient also with grade 4 erosive esophagitis in the distal third of  the esophagus probably from the recurrent emesis pta.  stable,  currently tolerating full liquids without increasing abdominal distension.   Plan 1) continue bid ppi (protonix), have called rx to her pharmacy for post d/c use.  continue full liquids, specifics discussed with caretaker daughter and patient.  I have arranged to repeat egd 3/24 to reassess duodenal lesion.  Patient also to fu with onc/Dr Mosca?  Case discussed with Dr Earleen Newport. Anticipate d/c today.   Electronic Signatures: Loistine Simas (MD)  (Signed 12-Mar-14 16:37)  Authored: Chief Complaint, VITAL SIGNS/ANCILLARY NOTES, Brief Assessment, Lab Results, Radiology Results, Assessment/Plan   Last Updated: 12-Mar-14 16:37 by Loistine Simas (MD)

## 2015-03-27 NOTE — Consult Note (Signed)
PATIENT NAME:  Amy Waters, HENZLER MR#:  [id number] DATE OF BIRTH:  Nov 24, 1931  ONCOLOGY CONSULTATION  DATE OF CONSULTATION:  02/12/2013  REFERRING PHYSICIAN: Dr. Gustavo Lah.   CONSULTING PHYSICIAN: Sandeep R. Ma Hillock, M.D.   REASON FOR CONSULTATION: Partial to near-complete gastric outlet obstruction; history of pancreatic mass.   HISTORY OF PRESENT ILLNESS: The patient is a 81 year old female patient who is known to me from the past, last seen by me in March of 2013. The patient has history of a mass at the head of the pancreas, initially diagnosed in 2010 when she presented with obstructive jaundice. She had an ERCP and stent placement, with improvement in jaundice. CA-19-9 in the past has been normal, ERCP biopsy was negative for malignancy. More recently, patient has been following with [facility] surgical oncologist, Dr. Lazarus Gowda, and is under surveillance since the mass has not been growing quickly and she has not had many symptoms. It was felt to be more likely a neuroendocrine tumor. The patient is currently admitted to the hospital on March 10 with symptoms of persistent nausea, progressive vomiting, and weakness. Oral intake has been poor for the last few days. She had an EGD done on March 10, which reports esophagitis, a small amount of food residue in the stomach, duodenal ulceration, and near-complete obstruction of the duodenum at the level of the second portion. Per discussion with GI, Dr. Gustavo Lah was unable to advance the scope easily due to the tightness, and he was concerned about the pancreatic mass. She also had a repeat CT scan done of the abdomen and pelvis on March 9, which continues to report ill-defined mass in the pancreatic head, slightly more conspicuous than prior. CBD stent terminates in the region of the second segment of duodenum, small ill-defined opacity in the right lower lobe of the lung may be infectious or inflammatory, new 3 mm indeterminate nodule in the right lower lobe.   The  patient today states that she has been started on a full-liquid diet, and so far has been able to keep a small amount of food down. She is also able to drink some Ensure without nausea or vomiting. Denies major abdominal pain. No diarrhea. No bright red blood in stools or melena. States that she has chronic generalized weakness and fatigue on exertion, which is mostly unchanged, currently weaker due to poor oral intake.   PAST MEDICAL HISTORY AND SURGICAL HISTORY: 1.  Pancreatic mass, as described above.  2.  Hypertension.  3.  Anxiety.  4.  Chronic anemia, likely secondary to chronic disease.  5.  Right knee arthroplasty.  6.  Rotator cuff tear repairs x3.  7.  Smoking history; quit greater than 15 years ago.   FAMILY HISTORY: Denies malignancy.   SOCIAL HISTORY: Quit smoking greater than 15 years ago. Drinks 1 beer on a daily basis. Denies recreational drug usage. Lives by herself.   HOME MEDICATIONS: Vitamin D3 at 2000 units daily, Spiriva 18 mcg inhalation daily, Xanax 0.5 mg 2 tablets daily, Prilosec 20 mg daily.  Creon 1 capsule t.i.d., escitalopram 20 mg daily, propranolol 20 mg b.i.d., triamterene 12.5 mg daily.   ALLERGIES: Include MORPHINE.   REVIEW OF SYSTEMS: CONSTITUTIONAL: As in HPI. No fever or chills.  HEENT: No headaches, dizziness, epistaxis, ear or jaw pain.  CARDIAC: Denies angina, palpitation, orthopnea, or paroxysmal nocturnal dyspnea.  LUNGS: Denies any new dyspnea, cough, sputum, hemoptysis, or chest pain.  GASTROINTESTINAL: As in HPI.  GENITOURINARY: No dysuria or hematuria.  SKIN:  No new rashes or pruritus.  HEMATOLOGIC: Denies obvious bleeding symptoms.  MUSCULOSKELETAL: No new bone pains; chronic arthritis unchanged.  NEUROLOGIC: No new focal weakness, seizures, or loss of consciousness.  ENDOCRINE: No polyuria or polydipsia.   PHYSICAL EXAMINATION: GENERAL: The patient is elderly, weak-looking, otherwise alert and oriented x4 and converses  appropriately. No acute distress. No icterus.  VITAL SIGNS: 98.1, 72, 18, 122/69, 94% on room air.  HEENT: Normocephalic, atraumatic. Extraocular movements intact. Sclerae anicteric. No oral thrush.  NECK: Negative for lymphadenopathy.  CARDIOVASCULAR: S1 and S2, regular rate and rhythm.  LUNGS: Lungs show bilateral good air entry, decreased at bases, no rhonchi.  ABDOMEN: Soft, epigastrium tender. Otherwise no major distention or guarding or rigidity. Bowel sounds present. No hepatomegaly or masses palpable clinically.  EXTREMITIES: Show mild edema. No cyanosis.  SKIN: Shows no generalized rashes or major bruising.  NEUROLOGIC: Cranial nerves are intact, moves all extremities spontaneously.  MUSCULOSKELETAL: No obvious joint deformity or swelling.   LAB RESULTS: BUN 22, creatinine 1.32, sodium 140, potassium 3.9, calcium 7.7. Hemoglobin 9.8. WBC yesterday 8100, platelets 129. Liver function tests on March 9 showed bilirubin 0.6, alkaline phosphatase 145, ALT and AST normal, albumin 3.3. Lipase unremarkable at 56.   IMPRESSION AND RECOMMENDATIONS: A 81 year old female patient with a known history of  mass in the head of the pancreas since 2010, status post endoscopic retrograde cholangiopancreatography and stenting, with improvement in obstructive jaundice at that time, since then has been on monitoring by surgeon, Dr. Lazarus Gowda, from [facility], more likely felt to have a neuroendocrine tumor since it has not progressed remarkably since diagnosis. Currently admitted with symptoms of gastric outlet obstruction. Esophagogastroduodenoscopy shows ulceration and narrowing in the second portion of the duodenum. Repeat CT scan shows only mildly increased conspicuity of the pancreatic mass; otherwise no new lesions or masses reported. The patient clinically able to tolerate a small amount of full-liquid diet for the first time today. Advise continuing to progress diet as tolerated to see if she can take orally. Per  discussion with Dr. Gustavo Lah, he is concerned the patient may need a feeding tube for bypass if she is unable to take adequate amounts orally, and also concern is if pancreatic mass could be causing some extrinsic compression on the duodenum resulting in these symptoms. Will discuss scan with her surgeon from [place], Dr. Lazarus Gowda, and see if he has any further recommendations. In the meantime, continue current supportive treatment and progress diet as tolerated. The patient states that if she needs any kind of surgical procedure, including feeding tube placement, she would prefer it to be done at [facility]. Will continue to follow. She does not have any acute pain issues at this time. The patient and daughter present were explained above. They are agreeable to this plan. Thank you for the referral. Please feel free to contact me if any additional questions.      ____________________________ Rhett Bannister Ma Hillock, MD srp:[id number] D: 02/12/2013 14:18:00 ET T: 02/12/2013 15:17:24 ET JOB#: [id number]  cc: Sandeep R. Ma Hillock, MD, <Dictator> Lollie Sails, MD Alveta Heimlich MD ELECTRONICALLY SIGNED 02/12/2013 22:46

## 2015-03-27 NOTE — Consult Note (Signed)
Chief Complaint:  Subjective/Chief Complaint Patient seen and examined, chart reviewed.  Please see full GI consult.  Patient admitted with n/v, coffeeground emesis starting3 days ago.  Hisotry of pancreatic mass of uncertain etiology? possible neuroendocrie tumor.  Patien has had EUS.Estil Daft and nothas a expandible stent. /  Was recently increased on pancereatic enzymes  for possible pancreatic diarrhea.  Seen at [facility] by Dr Lazarus Gowda, rare motrin use.  No emesis since yesterday am.  Has not had any black stools. Will plan for luminal evaluation via egd to further evaluate.  Concern?DDx to include Mack Guise tear, gastritis, ulceration, stent erosion.  Patient on 20 mg omeprazole as o/p.  I have discussed the risks benefits and complications of egd to include not limited to bleeding infection perforation and sedation and she wishes to proceed.   Electronic Signatures: Loistine Simas (MD)  (Signed 10-Mar-14 14:14)  Authored: Chief Complaint   Last Updated: 10-Mar-14 14:14 by Loistine Simas (MD)

## 2015-03-27 NOTE — Discharge Summary (Signed)
PATIENT NAME:  Amy Waters, Amy Waters MR#:  [id number] DATE OF BIRTH:  [birth year]  DATE OF ADMISSION:  02/11/2013 DATE OF DISCHARGE:    PRIMARY CARE PHYSICIAN: Jerrell Belfast, MD  GASTROENTEROLOGIST: Lollie Sails, MD   ONCOLOGISTS: Rhett Bannister. Ma Hillock, MD; Nickola Major. Mosca, MD  FINAL DIAGNOSES:  1.  Epigastric pain, nausea and vomiting.  2.  Esophagitis, duodenal ulcer and obstruction seen on endoscopy.  3.  Acute renal failure which improved.  4.  Tremor.  5.  History of pancreatic mass.   MEDICATIONS ON DISCHARGE: Include Xanax 0.5 mg 2 tablets once a day at bedtime, propranolol 20 mg twice a day, Spiriva 1 inhalation daily, Lexapro 20 mg daily, vitamin D3 at 1000 international units daily, Creon 24,000 units/76,000 units /20,000 units oral delayed capsule 1 capsule 3 times a day, Ensure 240 mL 4 times a day, Protonix 40 mg twice a day.   DIET: Full liquid diet with Ensure.   ACTIVITY: As tolerated.   FOLLOWUP: With Dr. Gustavo Lah in 2 weeks for endoscopy, Dr. Lazarus Gowda as followup, in 1 to 2 weeks with Dr. Venia Minks.   REASON FOR ADMISSION: The patient was admitted 02/11/2013, discharged 02/13/2013. The patient came in with nausea, vomiting, epigastric pain. The patient was kept n.p.o. and consultation was obtained by Dr. Gustavo Lah.  LABORATORY AND RADIOLOGICAL DATA DURING HOSPITAL COURSE: Included urinalysis that showed trace ketones. Glucose 219, BUN 28, creatinine 1.59, sodium 137, potassium 3.9, chloride 97, CO2 of 33, calcium 9.7. Liver function tests: Alkaline phosphatase 145. White blood cell count 11.2, hemoglobin and hematocrit 12.0 and 36.3, platelet count 164. Chest and abdomen x-ray showed nonobstructive bowel gas pattern, unremarkable chest x-ray. CT scan of the abdomen and pelvis showed ill-defined mass in the pancreatic head, more conspicuous than prior; small ill-defined in the right lower lobe, may be infectious or inflammatory or more ominous etiology. Hemoglobin upon discharge 9.6.  Creatinine upon discharge 1.07. Last abdomen flat, upright x-ray showed essentially stable abdominal study, continued findings concerning for gastric outlet obstruction. Dr. Gustavo Lah did an endoscopy on 02/11/2013, showed LA grade D erosive esophagitis, small amount of food in the stomach, duodenal ulcerations, near complete obstruction of the duodenum at the level of the second portion of the duodenum.   HOSPITAL COURSE PER PROBLEM LIST:  1.  For the patient's epigastric pain, nausea, vomiting, that had subsided with starting of IV fluids and IV Protonix drip during the hospital course. The patient's symptoms had resolved.  2.  The patient had an endoscopy which diagnosed esophagitis, duodenal ulcer and obstruction. The patient does have a history of pancreatic mass. Dr. Ma Hillock did see in consultation, who will refer to Dr. Lazarus Gowda as outpatient. The patient was put on a clear liquid diet, then a full liquid diet with Ensure. The patient did tolerate this without any further nausea and vomiting. Dr. Gustavo Lah recommended being on Protonix twice a day, which he called in to the pharmacy which was approved by the insurance company. Dr. Gustavo Lah will do an endoscopy in 2 weeks to see how that area is looking. Hopefully inflammation will be down on that area and he can get a better view. Diet will depend on further endoscopy. The patient will be maintained on full liquid diet and Ensure for nutrition.  3.  Acute renal failure. This had improved with IV fluid hydration. The patient is tolerating Ensure and liquids. I told the patient to stop triamterene.  4.  For the patient's tremor, restarted on propranolol.  5.  For the pancreatic mass, will follow up with Dr. Lazarus Gowda to review the CAT scan report.  6.  COPD, on Spiriva.   TIME SPENT ON DISCHARGE: 35 minutes.    ____________________________ Tana Conch. Leslye Peer, MD rjw:jm D: 02/13/2013 09:73:53 ET T: 02/13/2013 17:58:20 ET JOB#: [id number]  cc: Tana Conch.  Leslye Peer, MD, <Dictator> Jerrell Belfast, MD Lollie Sails, MD [place] MD ELECTRONICALLY SIGNED 02/14/2013 19:02

## 2015-03-27 NOTE — Consult Note (Signed)
Chief Complaint:  Subjective/Chief Complaint Case discussed with Dr Ma Hillock.  Concern for advancing size fo pancreatic lesion with secondary GOO at level of second portion of the duodenum.   He will see her and discuss with Dr Lazarus Gowda, surgeon following her.  Continue high dose PPI, will trial limited clears.  AM abdominal films.   Electronic Signatures: Loistine Simas (MD)  (Signed 10-Mar-14 16:17)  Authored: Chief Complaint   Last Updated: 10-Mar-14 16:17 by Loistine Simas (MD)

## 2015-03-27 NOTE — Consult Note (Signed)
ONCOLOGY folloup note - states she is doing better, tolerating full liquid diet. No nausea or vomiting today.still weak overall. No fever.NAD.          vitals - afebrile          lungs - b/l good air entry          abd - soft, nontender          ext - trace edemaWBC 6.2, Hb 9.6, platelets 23K  81 year old female patient with a known history of  mass in the head of the pancreas since 2010, status post endoscopic retrograde cholangiopancreatography and stenting, with improvement in obstructive jaundice at that time, since then has been on monitoring by surgeon, Dr. Lazarus Gowda, from [facility], more likely felt to have a neuroendocrine tumor since it has not progressed remarkably since diagnosis. Currently admitted with symptoms of gastric outlet obstruction. EGD showed ulceration and narrowing in the second portion of the duodenum. Repeat CT scan showed only mildly increased conspicuity of the pancreatic mass; otherwise no new lesions or masses reported. The patient clinically is able to tolerate full-liquid diet today and is being discvharged home. Given this, will request for her to followup as outpt with surgeon Dr. Lazarus Gowda when he comes next Thursday since patient states that if she needs any kind of surgical procedure, including feeding tube placement, she would prefer it to be done at [facility]. She does not have any acute pain issues at this time. The patient was explained above and she is agreeable to this plan.   Electronic Signatures: Jonn Shingles (MD)  (Signed on 13-Mar-14 00:28)  Authored  Last Updated: 13-Mar-14 00:28 by Jonn Shingles (MD)

## 2015-03-27 NOTE — Consult Note (Signed)
Chief Complaint:  Subjective/Chief Complaint seen for hematemesis.  patient denies n/v today, mild epigastric pain.  no bm overnight. now on full liquids.   VITAL SIGNS/ANCILLARY NOTES: **Vital Signs.:   11-Mar-14 14:13  Vital Signs Type Routine  Temperature Temperature (F) 98.2  Celsius 36.7  Temperature Source oral  Pulse Pulse 68  Respirations Respirations 18  Systolic BP Systolic BP 300  Diastolic BP (mmHg) Diastolic BP (mmHg) 65  Mean BP 79  Pulse Ox % Pulse Ox % 94  Pulse Ox Activity Level  At rest  Oxygen Delivery Room Air/ 21 %   Brief Assessment:  Cardiac Irregular   Respiratory clear BS   Gastrointestinal details normal Soft  No masses palpable  mild tenderness across the upper avdomen, mild upper abdominal  distension.bowel sounds positive.   Lab Results: Routine Chem:  11-Mar-14 06:11   Glucose, Serum  122  BUN  22  Creatinine (comp)  1.32  Sodium, Serum 140  Potassium, Serum 3.9  Chloride, Serum  108  CO2, Serum 25  Calcium (Total), Serum  7.7  Anion Gap 7  Osmolality (calc) 284  eGFR (African American)  44  eGFR (Non-African American)  38 (eGFR values <63m/min/1.73 m2 may be an indication of chronic kidney disease (CKD). Calculated eGFR is useful in patients with stable renal function. The eGFR calculation will not be reliable in acutely ill patients when serum creatinine is changing rapidly. It is not useful in  patients on dialysis. The eGFR calculation may not be applicable to patients at the low and high extremes of body sizes, pregnant women, and vegetarians.)  Routine Hem:  11-Mar-14 06:11   Hemoglobin (CBC)  9.8 (Result(s) reported on 12 Feb 2013 at 06:45AM.)   Radiology Results: XRay:    11-Mar-14 10:10, Abdomen Flat and Erect  Abdomen Flat and Erect   REASON FOR EXAM:    partial gastric outlet obstruction.  COMMENTS:       PROCEDURE: DXR - DXR ABDOMEN 2 V FLAT AND ERECT  - Feb 12 2013 10:10AM     RESULT: Monitoring electrodes are  present. There appears to be a moderate   amount of gastric distention. Vascular stent projects just to the right   of the upper lumbar region as noted previously. Severe degenerative   changes are seen in the lumbar spine. No free air is evident.    IMPRESSION:   1. Moderate amount of air within the stomach.    Dictation Site: 2    Verified By: GSundra Aland M.D., MD   Assessment/Plan:  Assessment/Plan:  Assessment 1) hematemesis-duodenal ulcer on egd with near complete obstruction of the second portion of the duodenum from local edema versus possible expansion of pancreatic mass.  currently on iv ppi.  change of diet to full liquids noted.   Plan 1) continue current.  would not advance beyond full liquids until repeat egd in about 2 weeks.  following.   Electronic Signatures: SLoistine Simas(MD)  (Signed 11-Mar-14 18:22)  Authored: Chief Complaint, VITAL SIGNS/ANCILLARY NOTES, Brief Assessment, Lab Results, Radiology Results, Assessment/Plan   Last Updated: 11-Mar-14 18:22 by SLoistine Simas(MD)
